# Patient Record
Sex: Male | Born: 1975 | Race: White | Hispanic: No | Marital: Single
Health system: Southern US, Community
[De-identification: ages and names within clinical notes are randomized; demographics above are authoritative.]

## PROBLEM LIST (undated history)

## (undated) HISTORY — PX: KNEE SURGERY: SHX244

## (undated) HISTORY — PX: OTHER SURGICAL HISTORY: SHX169

## (undated) HISTORY — PX: APPENDECTOMY: SHX54

---

## 2019-08-01 ENCOUNTER — Other Ambulatory Visit: Payer: Self-pay

## 2019-08-01 ENCOUNTER — Emergency Department (HOSPITAL_BASED_OUTPATIENT_CLINIC_OR_DEPARTMENT_OTHER): Payer: Worker's Compensation

## 2019-08-01 ENCOUNTER — Encounter (HOSPITAL_BASED_OUTPATIENT_CLINIC_OR_DEPARTMENT_OTHER): Payer: Self-pay | Admitting: Emergency Medicine

## 2019-08-01 ENCOUNTER — Emergency Department (HOSPITAL_BASED_OUTPATIENT_CLINIC_OR_DEPARTMENT_OTHER)
Admission: EM | Admit: 2019-08-01 | Discharge: 2019-08-01 | Disposition: A | Discharge status: Home / Self Care | Payer: Worker's Compensation | Attending: Emergency Medicine | Admitting: Emergency Medicine

## 2019-08-01 DIAGNOSIS — Y9289 Other specified places as the place of occurrence of the external cause: Secondary | ICD-10-CM | POA: Insufficient documentation

## 2019-08-01 DIAGNOSIS — Y999 Unspecified external cause status: Secondary | ICD-10-CM | POA: Diagnosis not present

## 2019-08-01 DIAGNOSIS — Z23 Encounter for immunization: Secondary | ICD-10-CM | POA: Insufficient documentation

## 2019-08-01 DIAGNOSIS — W231XXA Caught, crushed, jammed, or pinched between stationary objects, initial encounter: Secondary | ICD-10-CM | POA: Diagnosis not present

## 2019-08-01 DIAGNOSIS — Y9389 Activity, other specified: Secondary | ICD-10-CM | POA: Insufficient documentation

## 2019-08-01 DIAGNOSIS — S61216A Laceration without foreign body of right little finger without damage to nail, initial encounter: Secondary | ICD-10-CM | POA: Insufficient documentation

## 2019-08-01 MED ORDER — LIDOCAINE HCL (PF) 1 % IJ SOLN
5.0000 mL | Freq: Once | INTRAMUSCULAR | Status: AC
  Administered 2019-08-01: 5 mL
  Filled 2019-08-01: qty 5

## 2019-08-01 MED ORDER — TETANUS-DIPHTH-ACELL PERTUSSIS 5-2.5-18.5 LF-MCG/0.5 IM SUSP
0.5000 mL | Freq: Once | INTRAMUSCULAR | Status: AC
  Administered 2019-08-01: 0.5 mL via INTRAMUSCULAR
  Filled 2019-08-01: qty 0.5

## 2019-08-01 NOTE — ED Provider Notes (Signed)
[facility] EMERGENCY DEPARTMENT Provider Note   CSN: [id number] Arrival date & time: 08/01/19  1116     History Chief Complaint  Patient presents with  . Extremity Laceration    Casey Morse is a 44 y.o. male with no significant past medical history who presents for evaluation of laceration.  Patient got his right little finger caught between a refrigerator in a wall.  Suffered a laceration over the PIP.  No bony tenderness.  Unknown last tetanus.  He is not anticoagulated.  Denies fever, chills, nausea,, decreased range of motion, redness, swelling, warmth or drainage to them.  No paresthesias.  Has admitted to some mild bleeding when he flexes at the PIP.  Denies additional aggravating or relieving factors rates pain a 7/10.  History obtained from patient and past medical record.  No interpreter used.  HPI     History reviewed. No pertinent past medical history.  There are no problems to display for this patient.   Past Surgical History:  Procedure Laterality Date  . APPENDECTOMY    . arm surgery    . KNEE SURGERY    . leg surgery         No family history on file.  Social History   Tobacco Use  . Smoking status: Not on file  Substance Use Topics  . Alcohol use: Not on file  . Drug use: Not on file    Home Medications Prior to Admission medications   Not on File    Allergies    Patient has no allergy information on record.  Review of Systems   Review of Systems  Constitutional: Negative.   HENT: Negative.   Respiratory: Negative.   Cardiovascular: Negative.   Gastrointestinal: Negative.   Genitourinary: Negative.   Musculoskeletal: Negative.   Skin: Positive for wound.  Neurological: Negative.   All other systems reviewed and are negative.   Physical Exam Updated Vital Signs BP 111/79 (BP Location: Left Arm)   Pulse (!) 59   Temp 98.2 F (36.8 C) (Oral)   Resp 18   Ht 5\' 9"  (1.753 m)   Wt 65.8 kg   SpO2 99%   BMI 21.41  kg/m   Physical Exam Vitals and nursing note reviewed.  Constitutional:      General: He is not in acute distress.    Appearance: He is well-developed. He is not ill-appearing, toxic-appearing or diaphoretic.  HENT:     Head: Normocephalic and atraumatic.     Nose: Nose normal.     Mouth/Throat:     Mouth: Mucous membranes are moist.     Pharynx: Oropharynx is clear.  Eyes:     Pupils: Pupils are equal, round, and reactive to light.  Cardiovascular:     Rate and Rhythm: Normal rate and regular rhythm.     Pulses: Normal pulses.     Heart sounds: Normal heart sounds.  Pulmonary:     Effort: Pulmonary effort is normal. No respiratory distress.     Breath sounds: Normal breath sounds.  Abdominal:     General: Bowel sounds are normal. There is no distension.     Palpations: Abdomen is soft.  Musculoskeletal:        General: Normal range of motion.     Right wrist: Normal.     Left wrist: Normal.     Right hand: Laceration and tenderness present. No swelling, deformity or bony tenderness. Normal range of motion. Normal strength. Normal sensation. There is no disruption  of two-point discrimination. Normal capillary refill. Normal pulse.     Left hand: Normal.       Hands:     Cervical back: Normal range of motion and neck supple.     Comments: Patient with laceration over PIP to fifth digit on right upper extremity.  No bony tenderness.  Able to make fist, flex, extend that digits.  No bony tenderness to metacarpals or wrist.  Compartments soft. No nail involvement.   Skin:    General: Skin is warm and dry.     Capillary Refill: Capillary refill takes less than 2 seconds.     Comments: 1.5 centimeter laceration over PIP on fifth digit on right upper extremity.  Some mild oozing of bleeding however no pulsatile bleeding.  No drainage.  No surrounding erythema or warmth.  Neurological:     Mental Status: He is alert.     Sensory: Sensation is intact.     Motor: Motor function is  intact.     Coordination: Coordination is intact.     Gait: Gait is intact.     Comments: Intact sensation.     ED Results / Procedures / Treatments   Labs (all labs ordered are listed, but only abnormal results are displayed) Labs Reviewed - No data to display  EKG None  Radiology DG Hand Complete Right  Result Date: 08/01/2019 CLINICAL DATA:  Laceration and crush type injury fifth digit EXAM: RIGHT HAND - COMPLETE 3+ VIEW COMPARISON:  None. FINDINGS: Frontal, oblique, and lateral views were obtained. There is a bandage overlying the fifth PIP joint. No other radiopaque foreign body evident. Soft tissue laceration is seen in this area. There is no demonstrable fracture or dislocation. Joint spaces appear normal. No erosive change IMPRESSION: Soft tissue laceration at the fifth PIP joint level. No radiopaque foreign body beyond overlying bandage. No fracture or dislocation. No appreciable arthropathy. Electronically Signed   By: Bretta Bang III M.D.   On: 08/01/2019 12:25    Procedures .Marland KitchenLaceration Repair  Date/Time: 08/01/2019 2:19 PM Performed by: Linwood Dibbles, PA-C Authorized by: Linwood Dibbles, PA-C   Consent:    Consent obtained:  Verbal   Consent given by:  Patient   Risks discussed:  Infection, need for additional repair, pain, poor cosmetic result and poor wound healing   Alternatives discussed:  No treatment and delayed treatment Universal protocol:    Procedure explained and questions answered to patient or proxy's satisfaction: yes     Relevant documents present and verified: yes     Test results available and properly labeled: yes     Imaging studies available: yes     Required blood products, implants, devices, and special equipment available: yes     Site/side marked: yes     Immediately prior to procedure, a time out was called: yes     Patient identity confirmed:  Verbally with patient Anesthesia (see MAR for exact dosages):    Anesthesia  method:  Nerve block   Block location:  Digital block   Block anesthetic:  Lidocaine 1% w/o epi   Block injection procedure:  Anatomic landmarks identified, anatomic landmarks palpated, introduced needle, negative aspiration for blood and incremental injection   Block outcome:  Anesthesia achieved Laceration details:    Location:  Finger   Finger location:  R small finger   Length (cm):  1.5   Depth (mm):  4 Repair type:    Repair type:  Intermediate Pre-procedure details:  Preparation:  Patient was prepped and draped in usual sterile fashion and imaging obtained to evaluate for foreign bodies Exploration:    Hemostasis achieved with:  Direct pressure   Wound exploration: wound explored through full range of motion and entire depth of wound probed and visualized     Wound extent: no foreign bodies/material noted, no muscle damage noted, no nerve damage noted, no tendon damage noted, no underlying fracture noted and no vascular damage noted     Contaminated: no   Treatment:    Area cleansed with:  Betadine   Amount of cleaning:  Extensive   Irrigation solution:  Sterile saline   Irrigation method:  Pressure wash Skin repair:    Repair method:  Sutures   Suture size:  5-0   Suture material:  Prolene   Suture technique:  Simple interrupted   Number of sutures:  6 Approximation:    Approximation:  Close Post-procedure details:    Dressing:  Non-adherent dressing and splint for protection   Patient tolerance of procedure:  Tolerated well, no immediate complications   (including critical care time)  Medications Ordered in ED Medications  lidocaine (PF) (XYLOCAINE) 1 % injection 5 mL (5 mLs Infiltration Given 08/01/19 1305)  Tdap (BOOSTRIX) injection 0.5 mL (0.5 mLs Intramuscular Given 08/01/19 1304)    ED Course  I have reviewed the triage vital signs and the nursing notes.  Pertinent labs & imaging results that were available during my care of the patient were reviewed by me  and considered in my medical decision making (see chart for details).  45 year old presents for evaluation of a laceration which occurred just PTA.  He is afebrile, nonseptic, non-ill-appearing.  1.5 cm laceration over PIP to fifth digit on right upper extremity.  He has full range of motion with normal musculoskeletal exam.  He is neurovascularly intact.  Mild oozing of blood however no pulsatile bleeding.  He is able to passive and actively flex and extend at this digit.  No bony tenderness to hand. No nail bed involvement. Will update his tetanus.  Plain film shows no evidence of fracture or dislocation.  Patient with #6 Prolene sutures placed to this area.  Nonadhesive dressing placed.  Discussed wound care.  He will return in 7 to 10 days for suture removal.  Discussed return precautions.  Patient voiced understanding and is agreeable follow-up.    MDM Rules/Calculators/A&P                       Final Clinical Impression(s) / ED Diagnoses Final diagnoses:  Laceration of right little finger without foreign body without damage to nail, initial encounter    Rx / DC Orders ED Discharge Orders    None       Jalexa Pifer A, PA-C 08/01/19 1421    Dorie Rank, MD 08/01/19 1439

## 2019-08-01 NOTE — ED Triage Notes (Signed)
PT presents with c/o pinky finger to right hand laceration after moving a refrigerator today.  Finger wrapped in gauze and no bleeding noted

## 2019-08-01 NOTE — Discharge Instructions (Addendum)
Let warm soapy water run over this.  You need to return in 7 to 10 days to have sutures removed.  If you notice, fever, redness, warmth, yellow-green discharge please seek reevaluation sooner.  You will need to keep this wound covered while at work to ensure debris does not get into this.

## 2021-05-08 IMAGING — DX DG HAND COMPLETE 3+V*R*
1 series · 1 of 1 positions shown · non-contrast
Comparison: None.

CLINICAL DATA: Laceration and crush type injury fifth digit

EXAM:
RIGHT HAND - COMPLETE 3+ VIEW

[hand obl]
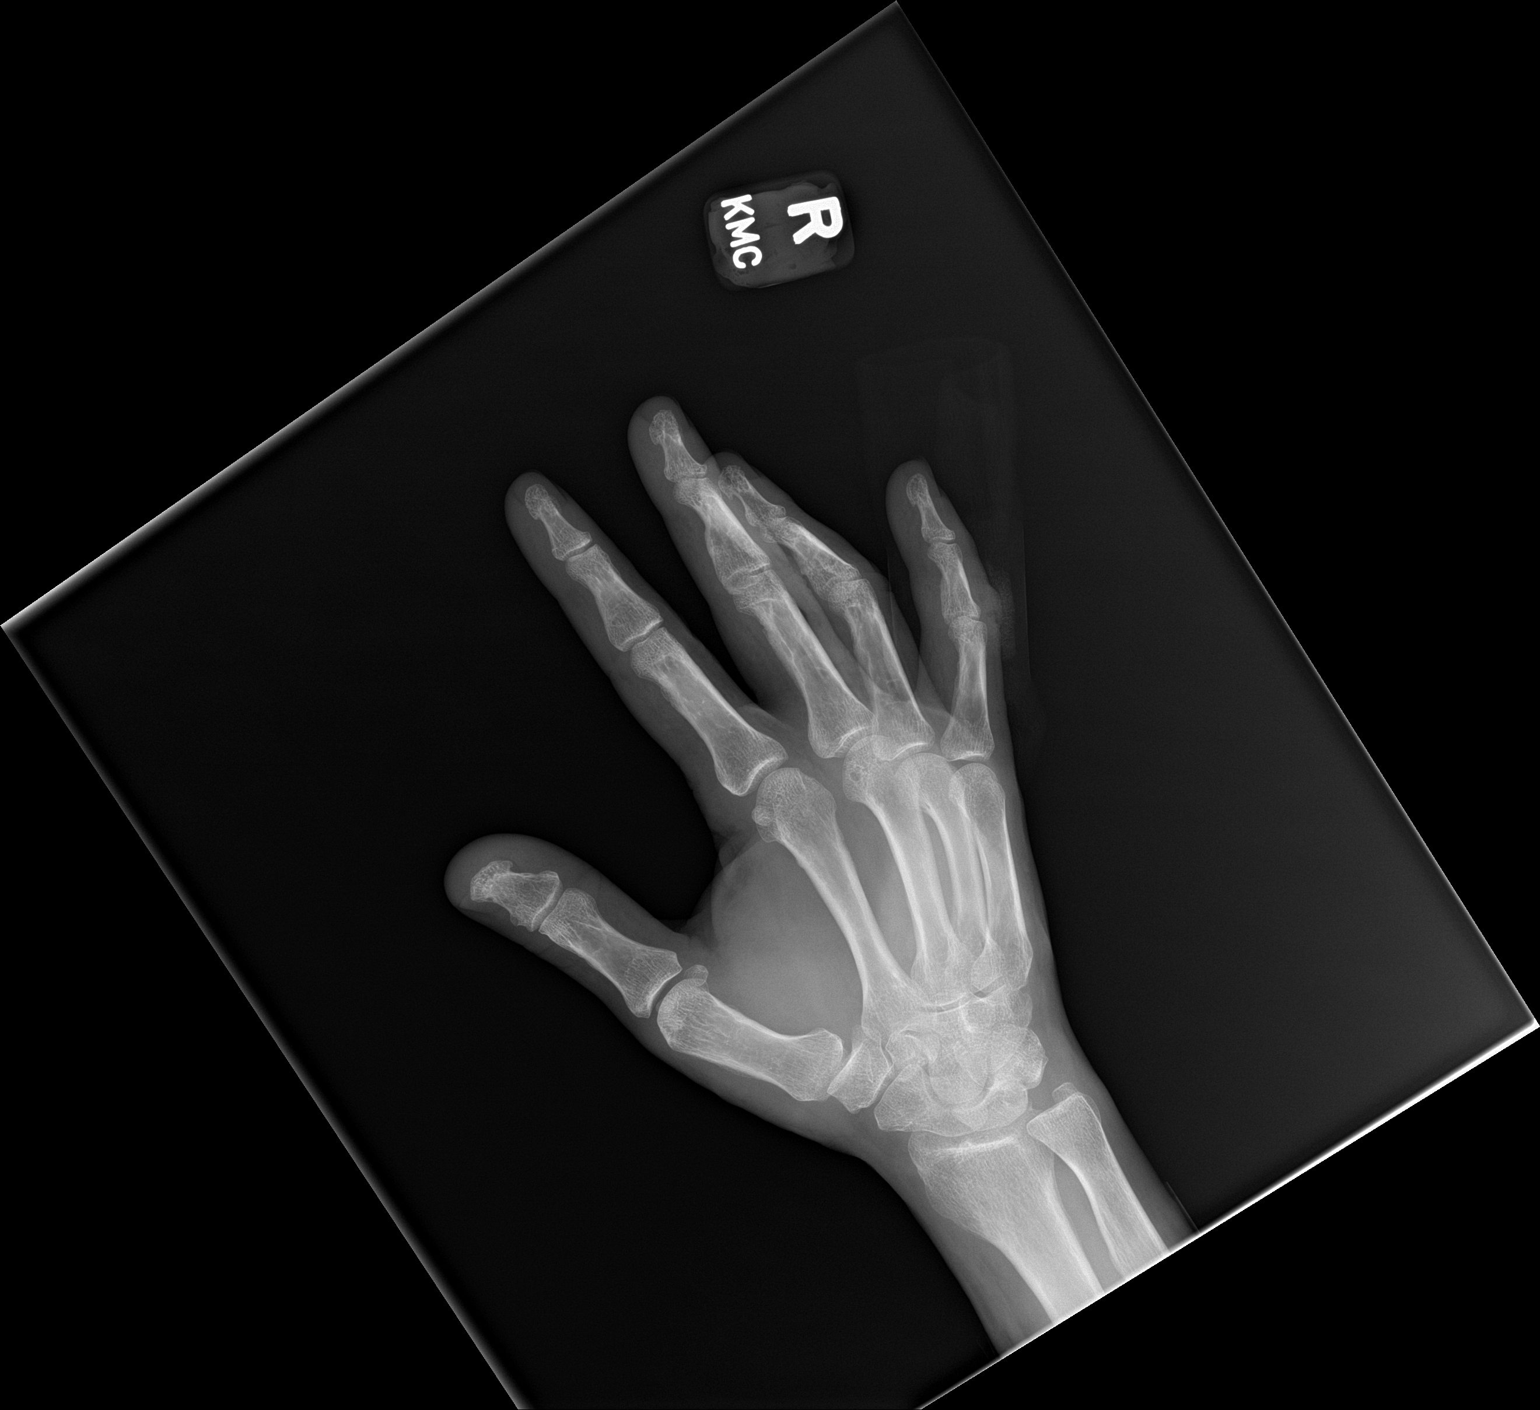

[1 of 1 positions shown; findings below may reference images not displayed]

FINDINGS: Frontal, oblique, and lateral views were obtained. There is a
bandage overlying the fifth PIP joint. No other radiopaque foreign
body evident. Soft tissue laceration is seen in this area.

There is no demonstrable fracture or dislocation. Joint spaces
appear normal. No erosive change
IMPRESSION: Soft tissue laceration at the fifth PIP joint level. No radiopaque
foreign body beyond overlying bandage. No fracture or dislocation.
No appreciable arthropathy.
